# Patient Record
Sex: Female | Born: 1950 | Race: White | Hispanic: No | Marital: Married | Smoking: Never smoker
Health system: Southern US, Community
[De-identification: ages and names within clinical notes are randomized; demographics above are authoritative.]

## PROBLEM LIST (undated history)

## (undated) DIAGNOSIS — J449 Chronic obstructive pulmonary disease, unspecified: Secondary | ICD-10-CM

## (undated) DIAGNOSIS — I10 Essential (primary) hypertension: Secondary | ICD-10-CM

## (undated) DIAGNOSIS — N289 Disorder of kidney and ureter, unspecified: Secondary | ICD-10-CM

---

## 2004-08-21 ENCOUNTER — Ambulatory Visit: Payer: Self-pay

## 2005-10-21 ENCOUNTER — Ambulatory Visit: Payer: Self-pay

## 2007-02-23 ENCOUNTER — Ambulatory Visit: Payer: Self-pay

## 2008-06-06 ENCOUNTER — Ambulatory Visit: Payer: Self-pay

## 2009-12-25 ENCOUNTER — Ambulatory Visit: Payer: Self-pay

## 2011-03-31 ENCOUNTER — Ambulatory Visit: Payer: Self-pay | Admitting: Family Medicine

## 2013-02-16 ENCOUNTER — Ambulatory Visit: Payer: Self-pay

## 2017-03-12 ENCOUNTER — Other Ambulatory Visit: Payer: Self-pay | Admitting: Family Medicine

## 2017-03-12 DIAGNOSIS — Z1231 Encounter for screening mammogram for malignant neoplasm of breast: Secondary | ICD-10-CM

## 2017-08-21 ENCOUNTER — Other Ambulatory Visit: Payer: Self-pay | Admitting: Family Medicine

## 2017-08-21 DIAGNOSIS — Z0000 Encounter for general adult medical examination without abnormal findings: Secondary | ICD-10-CM

## 2017-09-17 ENCOUNTER — Ambulatory Visit
Admission: RE | Admit: 2017-09-17 | Discharge: 2017-09-17 | Disposition: A | Discharge status: Home / Self Care | Payer: Medicare HMO | Source: Ambulatory Visit | Attending: Family Medicine | Admitting: Family Medicine

## 2017-09-17 DIAGNOSIS — R928 Other abnormal and inconclusive findings on diagnostic imaging of breast: Secondary | ICD-10-CM | POA: Diagnosis not present

## 2017-09-17 DIAGNOSIS — Z1231 Encounter for screening mammogram for malignant neoplasm of breast: Secondary | ICD-10-CM

## 2017-09-17 DIAGNOSIS — M810 Age-related osteoporosis without current pathological fracture: Secondary | ICD-10-CM | POA: Insufficient documentation

## 2017-09-17 DIAGNOSIS — Z13820 Encounter for screening for osteoporosis: Secondary | ICD-10-CM | POA: Diagnosis present

## 2017-09-17 DIAGNOSIS — Z0000 Encounter for general adult medical examination without abnormal findings: Secondary | ICD-10-CM

## 2017-09-22 ENCOUNTER — Other Ambulatory Visit: Payer: Self-pay | Admitting: Family Medicine

## 2017-09-22 DIAGNOSIS — R921 Mammographic calcification found on diagnostic imaging of breast: Secondary | ICD-10-CM

## 2017-09-22 DIAGNOSIS — R928 Other abnormal and inconclusive findings on diagnostic imaging of breast: Secondary | ICD-10-CM

## 2017-09-28 ENCOUNTER — Ambulatory Visit
Admission: RE | Admit: 2017-09-28 | Discharge: 2017-09-28 | Disposition: A | Discharge status: Home / Self Care | Payer: Medicare HMO | Source: Ambulatory Visit | Attending: Family Medicine | Admitting: Family Medicine

## 2017-09-28 DIAGNOSIS — R921 Mammographic calcification found on diagnostic imaging of breast: Secondary | ICD-10-CM

## 2017-09-28 DIAGNOSIS — R928 Other abnormal and inconclusive findings on diagnostic imaging of breast: Secondary | ICD-10-CM | POA: Insufficient documentation

## 2017-09-30 ENCOUNTER — Other Ambulatory Visit: Payer: Self-pay | Admitting: Family Medicine

## 2017-09-30 DIAGNOSIS — R928 Other abnormal and inconclusive findings on diagnostic imaging of breast: Secondary | ICD-10-CM

## 2017-09-30 DIAGNOSIS — R921 Mammographic calcification found on diagnostic imaging of breast: Secondary | ICD-10-CM

## 2017-10-07 ENCOUNTER — Ambulatory Visit
Admission: RE | Admit: 2017-10-07 | Discharge: 2017-10-07 | Disposition: A | Discharge status: Home / Self Care | Payer: Medicare HMO | Source: Ambulatory Visit | Attending: Family Medicine | Admitting: Family Medicine

## 2017-10-07 DIAGNOSIS — R921 Mammographic calcification found on diagnostic imaging of breast: Secondary | ICD-10-CM

## 2017-10-07 DIAGNOSIS — R928 Other abnormal and inconclusive findings on diagnostic imaging of breast: Secondary | ICD-10-CM

## 2017-10-07 HISTORY — PX: BREAST BIOPSY: SHX20

## 2017-10-08 LAB — SURGICAL PATHOLOGY

## 2018-05-11 ENCOUNTER — Other Ambulatory Visit: Payer: Self-pay | Admitting: Family Medicine

## 2018-05-11 DIAGNOSIS — Z1231 Encounter for screening mammogram for malignant neoplasm of breast: Secondary | ICD-10-CM

## 2018-09-20 ENCOUNTER — Ambulatory Visit
Admission: RE | Admit: 2018-09-20 | Discharge: 2018-09-20 | Disposition: A | Discharge status: Home / Self Care | Payer: Medicare HMO | Source: Ambulatory Visit | Attending: Family Medicine | Admitting: Family Medicine

## 2018-09-20 ENCOUNTER — Other Ambulatory Visit: Payer: Self-pay

## 2018-09-20 DIAGNOSIS — Z1231 Encounter for screening mammogram for malignant neoplasm of breast: Secondary | ICD-10-CM | POA: Diagnosis present

## 2018-09-24 ENCOUNTER — Encounter: Payer: Self-pay | Admitting: *Deleted

## 2018-09-24 ENCOUNTER — Other Ambulatory Visit: Payer: Self-pay

## 2018-09-24 DIAGNOSIS — N183 Chronic kidney disease, stage 3 (moderate): Secondary | ICD-10-CM | POA: Diagnosis present

## 2018-09-24 DIAGNOSIS — E876 Hypokalemia: Secondary | ICD-10-CM | POA: Diagnosis present

## 2018-09-24 DIAGNOSIS — J9601 Acute respiratory failure with hypoxia: Secondary | ICD-10-CM | POA: Diagnosis present

## 2018-09-24 DIAGNOSIS — J189 Pneumonia, unspecified organism: Principal | ICD-10-CM | POA: Diagnosis present

## 2018-09-24 DIAGNOSIS — R509 Fever, unspecified: Secondary | ICD-10-CM | POA: Diagnosis not present

## 2018-09-24 DIAGNOSIS — J440 Chronic obstructive pulmonary disease with acute lower respiratory infection: Secondary | ICD-10-CM | POA: Diagnosis present

## 2018-09-24 DIAGNOSIS — Z880 Allergy status to penicillin: Secondary | ICD-10-CM

## 2018-09-24 DIAGNOSIS — Z79899 Other long term (current) drug therapy: Secondary | ICD-10-CM

## 2018-09-24 DIAGNOSIS — Z881 Allergy status to other antibiotic agents status: Secondary | ICD-10-CM

## 2018-09-24 DIAGNOSIS — I129 Hypertensive chronic kidney disease with stage 1 through stage 4 chronic kidney disease, or unspecified chronic kidney disease: Secondary | ICD-10-CM | POA: Diagnosis present

## 2018-09-24 LAB — COMPREHENSIVE METABOLIC PANEL
ALK PHOS: 120 U/L (ref 38–126)
ALT: 41 U/L (ref 0–44)
AST: 43 U/L — AB (ref 15–41)
Albumin: 4.2 g/dL (ref 3.5–5.0)
Anion gap: 13 (ref 5–15)
BILIRUBIN TOTAL: 1.2 mg/dL (ref 0.3–1.2)
BUN: 22 mg/dL (ref 8–23)
CHLORIDE: 95 mmol/L — AB (ref 98–111)
CO2: 27 mmol/L (ref 22–32)
CREATININE: 0.93 mg/dL (ref 0.44–1.00)
Calcium: 9.9 mg/dL (ref 8.9–10.3)
GFR calc Af Amer: >60 mL/min (ref >60)
Glucose, Bld: 145 mg/dL — ABNORMAL HIGH (ref 70–99)
Potassium: 3.4 mmol/L — ABNORMAL LOW (ref 3.5–5.1)
Sodium: 135 mmol/L (ref 135–145)
TOTAL PROTEIN: 8.1 g/dL (ref 6.5–8.1)

## 2018-09-24 LAB — CBC WITH DIFFERENTIAL/PLATELET
Abs Immature Granulocytes: 0.07 10*3/uL (ref 0.00–0.07)
BASOS ABS: 0 10*3/uL (ref 0.0–0.1)
BASOS PCT: 0 %
Eosinophils Absolute: 0 10*3/uL (ref 0.0–0.5)
Eosinophils Relative: 0 %
HCT: 40.6 % (ref 36.0–46.0)
Hemoglobin: 13.4 g/dL (ref 12.0–15.0)
Immature Granulocytes: 1 %
LYMPHS PCT: 11 %
Lymphs Abs: 1.5 10*3/uL (ref 0.7–4.0)
MCH: 29.2 pg (ref 26.0–34.0)
MCHC: 33 g/dL (ref 30.0–36.0)
MCV: 88.5 fL (ref 80.0–100.0)
MONO ABS: 1.2 10*3/uL — AB (ref 0.1–1.0)
Monocytes Relative: 8 %
Neutro Abs: 11.7 10*3/uL — ABNORMAL HIGH (ref 1.7–7.7)
Neutrophils Relative %: 80 %
PLATELETS: 250 10*3/uL (ref 150–400)
RBC: 4.59 MIL/uL (ref 3.87–5.11)
RDW: 12.9 % (ref 11.5–15.5)
WBC: 14.5 10*3/uL — ABNORMAL HIGH (ref 4.0–10.5)
nRBC: 0 % (ref 0.0–0.2)

## 2018-09-24 LAB — URINALYSIS, COMPLETE (UACMP) WITH MICROSCOPIC
Bilirubin Urine: NEGATIVE
GLUCOSE, UA: NEGATIVE mg/dL
Hgb urine dipstick: NEGATIVE
Ketones, ur: 20 mg/dL — AB
Leukocytes,Ua: NEGATIVE
NITRITE: NEGATIVE
PH: 5 (ref 5.0–8.0)
PROTEIN: NEGATIVE mg/dL
SPECIFIC GRAVITY, URINE: 1.012 (ref 1.005–1.030)

## 2018-09-24 LAB — LACTIC ACID, PLASMA: Lactic Acid, Venous: 1.3 mmol/L (ref 0.5–1.9)

## 2018-09-24 MED ORDER — SODIUM CHLORIDE 0.9% FLUSH
3.0000 mL | Freq: Once | INTRAVENOUS | Status: AC
  Administered 2018-09-25: 3 mL via INTRAVENOUS

## 2018-09-24 NOTE — ED Triage Notes (Signed)
Pt reports fever, onset this afternoon when waking up from a nap. Has had a cough and sinus congestion. Tmax 101.0 today, no meds for the same.

## 2018-09-25 ENCOUNTER — Emergency Department: Payer: Medicare HMO

## 2018-09-25 ENCOUNTER — Inpatient Hospital Stay
Admission: EM | Admit: 2018-09-25 | Discharge: 2018-09-27 | DRG: 193 | Disposition: A | Discharge status: Home / Self Care | Payer: Medicare HMO | Attending: Internal Medicine | Admitting: Internal Medicine

## 2018-09-25 ENCOUNTER — Other Ambulatory Visit: Payer: Self-pay

## 2018-09-25 DIAGNOSIS — J189 Pneumonia, unspecified organism: Secondary | ICD-10-CM | POA: Diagnosis present

## 2018-09-25 DIAGNOSIS — Z79899 Other long term (current) drug therapy: Secondary | ICD-10-CM | POA: Diagnosis not present

## 2018-09-25 DIAGNOSIS — E876 Hypokalemia: Secondary | ICD-10-CM | POA: Diagnosis present

## 2018-09-25 DIAGNOSIS — J440 Chronic obstructive pulmonary disease with acute lower respiratory infection: Secondary | ICD-10-CM | POA: Diagnosis present

## 2018-09-25 DIAGNOSIS — Z881 Allergy status to other antibiotic agents status: Secondary | ICD-10-CM | POA: Diagnosis not present

## 2018-09-25 DIAGNOSIS — I129 Hypertensive chronic kidney disease with stage 1 through stage 4 chronic kidney disease, or unspecified chronic kidney disease: Secondary | ICD-10-CM | POA: Diagnosis present

## 2018-09-25 DIAGNOSIS — Z880 Allergy status to penicillin: Secondary | ICD-10-CM | POA: Diagnosis not present

## 2018-09-25 DIAGNOSIS — R509 Fever, unspecified: Secondary | ICD-10-CM | POA: Diagnosis present

## 2018-09-25 DIAGNOSIS — N183 Chronic kidney disease, stage 3 (moderate): Secondary | ICD-10-CM | POA: Diagnosis present

## 2018-09-25 DIAGNOSIS — J9601 Acute respiratory failure with hypoxia: Secondary | ICD-10-CM | POA: Diagnosis present

## 2018-09-25 HISTORY — DX: Essential (primary) hypertension: I10

## 2018-09-25 HISTORY — DX: Chronic obstructive pulmonary disease, unspecified: J44.9

## 2018-09-25 HISTORY — DX: Disorder of kidney and ureter, unspecified: N28.9

## 2018-09-25 LAB — HEMOGLOBIN A1C
Hgb A1c MFr Bld: 5.3 % (ref 4.8–5.6)
Mean Plasma Glucose: 105.41 mg/dL

## 2018-09-25 LAB — TSH: TSH: 2.625 u[IU]/mL (ref 0.350–4.500)

## 2018-09-25 LAB — INFLUENZA PANEL BY PCR (TYPE A & B)
Influenza A By PCR: NEGATIVE
Influenza B By PCR: NEGATIVE

## 2018-09-25 MED ORDER — IPRATROPIUM-ALBUTEROL 0.5-2.5 (3) MG/3ML IN SOLN
3.0000 mL | Freq: Once | RESPIRATORY_TRACT | Status: AC
  Administered 2018-09-25: 3 mL via RESPIRATORY_TRACT

## 2018-09-25 MED ORDER — ACETAMINOPHEN 325 MG PO TABS: 650.0000 mg | ORAL_TABLET | Freq: Four times a day (QID) | ORAL | Status: DC | PRN

## 2018-09-25 MED ORDER — LEVOFLOXACIN IN D5W 500 MG/100ML IV SOLN
500.0000 mg | Freq: Once | INTRAVENOUS | Status: AC
  Administered 2018-09-25: 500 mg via INTRAVENOUS
  Filled 2018-09-25: qty 100

## 2018-09-25 MED ORDER — METHYLPREDNISOLONE SODIUM SUCC 125 MG IJ SOLR
125.0000 mg | Freq: Once | INTRAMUSCULAR | Status: AC
  Administered 2018-09-25: 125 mg via INTRAVENOUS
  Filled 2018-09-25: qty 2

## 2018-09-25 MED ORDER — LEVOFLOXACIN IN D5W 250 MG/50ML IV SOLN
250.0000 mg | INTRAVENOUS | Status: DC
  Administered 2018-09-26 – 2018-09-27 (×2): 250 mg via INTRAVENOUS
  Filled 2018-09-25 (×2): qty 50

## 2018-09-25 MED ORDER — VITAMIN D 25 MCG (1000 UNIT) PO TABS
1000.0000 [IU] | ORAL_TABLET | Freq: Every day | ORAL | Status: DC
  Administered 2018-09-25 – 2018-09-27 (×3): 1000 [IU] via ORAL
  Filled 2018-09-25 (×3): qty 1

## 2018-09-25 MED ORDER — TRIAMTERENE-HCTZ 37.5-25 MG PO TABS
1.0000 | ORAL_TABLET | Freq: Every day | ORAL | Status: DC
  Administered 2018-09-25 – 2018-09-27 (×3): 1 via ORAL
  Filled 2018-09-25 (×3): qty 1

## 2018-09-25 MED ORDER — DOCUSATE SODIUM 100 MG PO CAPS
100.0000 mg | ORAL_CAPSULE | Freq: Two times a day (BID) | ORAL | Status: DC
  Administered 2018-09-25 – 2018-09-27 (×5): 100 mg via ORAL
  Filled 2018-09-25 (×5): qty 1

## 2018-09-25 MED ORDER — ONDANSETRON HCL 4 MG/2ML IJ SOLN: 4.0000 mg | Freq: Four times a day (QID) | INTRAMUSCULAR | Status: DC | PRN

## 2018-09-25 MED ORDER — POTASSIUM CHLORIDE ER 8 MEQ PO TBCR
8.0000 meq | EXTENDED_RELEASE_TABLET | Freq: Every day | ORAL | Status: DC
  Administered 2018-09-25 – 2018-09-26 (×2): 8 meq via ORAL
  Filled 2018-09-25 (×4): qty 1

## 2018-09-25 MED ORDER — ACETAMINOPHEN 650 MG RE SUPP: 650.0000 mg | Freq: Four times a day (QID) | RECTAL | Status: DC | PRN

## 2018-09-25 MED ORDER — AMLODIPINE BESYLATE 5 MG PO TABS
5.0000 mg | ORAL_TABLET | Freq: Every day | ORAL | Status: DC
  Administered 2018-09-25 – 2018-09-27 (×2): 5 mg via ORAL
  Filled 2018-09-25 (×2): qty 1

## 2018-09-25 MED ORDER — ONDANSETRON HCL 4 MG PO TABS: 4.0000 mg | ORAL_TABLET | Freq: Four times a day (QID) | ORAL | Status: DC | PRN

## 2018-09-25 MED ORDER — IOHEXOL 300 MG/ML  SOLN
75.0000 mL | Freq: Once | INTRAMUSCULAR | Status: AC | PRN
  Administered 2018-09-25: 75 mL via INTRAVENOUS

## 2018-09-25 MED ORDER — ENOXAPARIN SODIUM 40 MG/0.4ML ~~LOC~~ SOLN
40.0000 mg | SUBCUTANEOUS | Status: DC
  Administered 2018-09-25 – 2018-09-26 (×2): 40 mg via SUBCUTANEOUS
  Filled 2018-09-25 (×2): qty 0.4

## 2018-09-25 MED ORDER — IPRATROPIUM-ALBUTEROL 0.5-2.5 (3) MG/3ML IN SOLN
RESPIRATORY_TRACT | Status: AC
  Administered 2018-09-25: 3 mL via RESPIRATORY_TRACT
  Filled 2018-09-25: qty 6

## 2018-09-25 MED ORDER — METOPROLOL SUCCINATE ER 50 MG PO TB24
100.0000 mg | ORAL_TABLET | Freq: Every day | ORAL | Status: DC
  Administered 2018-09-25 – 2018-09-27 (×3): 100 mg via ORAL
  Filled 2018-09-25 (×3): qty 2

## 2018-09-25 NOTE — Progress Notes (Signed)
Advance care planning  Purpose of Encounter Pneumonia  Parties in Attendance Patient and husband at bedside  Patients Decisional capacity Alert and oriented.  Able to make medical decisions.  Documented healthcare power of attorney is her husband who is at bedside.  ACP documents in place.  Discussed with patient regarding her pneumonia, prognosis, treatment plan.  All questions answered.  CODE STATUS discussed and patient would like aggressive care with CPR and intubation if needed.  Orders entered  FULL CODE  Time spent - 20 minutes

## 2018-09-25 NOTE — ED Notes (Signed)
Pt up to use bedside toilet.

## 2018-09-25 NOTE — ED Notes (Signed)
ED TO INPATIENT HANDOFF REPORT  ED Nurse Name and Phone #: [phone]  S Name/Age/Gender Crystal Klein 68 y.o. female Room/Bed: ED06A/ED06A  Code Status   Code Status: Not on file  Home/SNF/Other Home Patient oriented to: self, place, person, time Is this baseline? Yes   Triage Complete: Triage complete  Chief Complaint Cough  Triage Note Pt reports fever, onset this afternoon when waking up from a nap. Has had a cough and sinus congestion. Tmax 101.0 today, no meds for the same.     Allergies Allergies  Allergen Reactions  . Amoxicillin   . Biaxin [Clarithromycin]   . Penicillins     Level of Care/Admitting Diagnosis ED Disposition    ED Disposition Condition Comment   Admit  Hospital Area: [facility] [[id number]]  Level of Care: Med-Surg [16]  Diagnosis: CAP (community acquired pneumonia) [606301]  Admitting Physician: Arnaldo Natal [[id number]]  Attending Physician: Arnaldo Natal [[id number]]  Estimated length of stay: past midnight tomorrow  Certification:: I certify this patient will need inpatient services for at least 2 midnights  PT Class (Do Not Modify): Inpatient [101]  PT Acc Code (Do Not Modify): Private [1]       B Medical/Surgery History Past Medical History:  Diagnosis Date  . COPD (chronic obstructive pulmonary disease) (HCC)   . Hypertension   . Renal disorder    stage 4 kidney disease   Past Surgical History:  Procedure Laterality Date  . BREAST BIOPSY Left 10/07/2017   affirm bx ribbon clip, : FIBROADENOMATOUS CHANGE     A IV Location/Drains/Wounds Patient Lines/Drains/Airways Status   Active Line/Drains/Airways    Name:   Placement date:   Placement time:   Site:   Days:   Peripheral IV 09/25/18 Right Antecubital   09/25/18    0137    Antecubital   less than 1          Intake/Output Last 24 hours No intake or output data in the 24 hours ending 09/25/18 0343  Labs/Imaging Results for orders placed  or performed during the hospital encounter of 09/25/18 (from the past 48 hour(s))  Lactic acid, plasma     Status: None   Collection Time: 09/24/18  8:15 PM  Result Value Ref Range   Lactic Acid, Venous 1.3 0.5 - 1.9 mmol/L    Comment: Performed at [facility], [address] [place], [place] [address]  Comprehensive metabolic panel     Status: Abnormal   Collection Time: 09/24/18  8:15 PM  Result Value Ref Range   Sodium 135 135 - 145 mmol/L   Potassium 3.4 (L) 3.5 - 5.1 mmol/L   Chloride 95 (L) 98 - 111 mmol/L   CO2 27 22 - 32 mmol/L   Glucose, Bld 145 (H) 70 - 99 mg/dL   BUN 22 8 - 23 mg/dL   Creatinine, Ser 7.06 0.44 - 1.00 mg/dL   Calcium 9.9 8.9 - 23.7 mg/dL   Total Protein 8.1 6.5 - 8.1 g/dL   Albumin 4.2 3.5 - 5.0 g/dL   AST 43 (H) 15 - 41 U/L   ALT 41 0 - 44 U/L   Alkaline Phosphatase 120 38 - 126 U/L   Total Bilirubin 1.2 0.3 - 1.2 mg/dL   GFR calc non Af Amer >60 >60 mL/min   GFR calc Af Amer >60 >60 mL/min   Anion gap 13 5 - 15    Comment: Performed at [facility], [address]  [place], [place] [address]  CBC with Differential     Status: Abnormal   Collection Time: 09/24/18  8:15 PM  Result Value Ref Range   WBC 14.5 (H) 4.0 - 10.5 K/uL   RBC 4.59 3.87 - 5.11 MIL/uL   Hemoglobin 13.4 12.0 - 15.0 g/dL   HCT 19.1 47.8 - 29.5 %   MCV 88.5 80.0 - 100.0 fL   MCH 29.2 26.0 - 34.0 pg   MCHC 33.0 30.0 - 36.0 g/dL   RDW 62.1 30.8 - 65.7 %   Platelets 250 150 - 400 K/uL   nRBC 0.0 0.0 - 0.2 %   Neutrophils Relative % 80 %   Neutro Abs 11.7 (H) 1.7 - 7.7 K/uL   Lymphocytes Relative 11 %   Lymphs Abs 1.5 0.7 - 4.0 K/uL   Monocytes Relative 8 %   Monocytes Absolute 1.2 (H) 0.1 - 1.0 K/uL   Eosinophils Relative 0 %   Eosinophils Absolute 0.0 0.0 - 0.5 K/uL   Basophils Relative 0 %   Basophils Absolute 0.0 0.0 - 0.1 K/uL   Immature Granulocytes 1 %   Abs Immature Granulocytes 0.07 0.00 - 0.07 K/uL    Comment: Performed at [facility], [address] [place], [place] [address]  Urinalysis, Complete w Microscopic     Status: Abnormal   Collection Time: 09/24/18  8:15 PM  Result Value Ref Range   Color, Urine YELLOW (A) YELLOW   APPearance CLEAR (A) CLEAR   Specific Gravity, Urine 1.012 1.005 - 1.030   pH 5.0 5.0 - 8.0   Glucose, UA NEGATIVE NEGATIVE mg/dL   Hgb urine dipstick NEGATIVE NEGATIVE   Bilirubin Urine NEGATIVE NEGATIVE   Ketones, ur 20 (A) NEGATIVE mg/dL   Protein, ur NEGATIVE NEGATIVE mg/dL   Nitrite NEGATIVE NEGATIVE   Leukocytes,Ua NEGATIVE NEGATIVE   RBC / HPF 0-5 0 - 5 RBC/hpf   WBC, UA 0-5 0 - 5 WBC/hpf   Bacteria, UA RARE (A) NONE SEEN   Squamous Epithelial / LPF 0-5 0 - 5   Mucus PRESENT     Comment: Performed at [facility], [address], [place], [place] [address]  Influenza panel by PCR (type A & B)     Status: None   Collection Time: 09/25/18  1:45 AM  Result Value Ref Range   Influenza A By PCR NEGATIVE NEGATIVE   Influenza B By PCR NEGATIVE NEGATIVE    Comment: (NOTE) The Xpert Xpress Flu assay is intended as an aid in the diagnosis of  influenza and should not be used as a sole basis for treatment.  This  assay is FDA approved for nasopharyngeal swab specimens only. Nasal  washings and aspirates are unacceptable for Xpert Xpress Flu testing. Performed at [facility], [address] [place], [place] [address]    Dg Chest 2 View  Result Date: 09/25/2018 CLINICAL DATA:  Fever onset this afternoon to 101 max, cough, sinus congestion; history hypertension, COPD EXAM: CHEST - 2 VIEW COMPARISON:  None. FINDINGS: Normal heart size, mediastinal contours, and pulmonary vascularity. Atherosclerotic calcification aorta. Emphysematous and bronchitic changes consistent with COPD. Density of LEFT upper lobe question nodular density versus calcified plaque. Minimal atelectasis at RIGHT base. No definite infiltrate or pneumothorax. Bones demineralized. IMPRESSION: COPD  changes with RIGHT basilar atelectasis and questionable LEFT upper lobe nodular density; CT chest recommended to exclude tumor. Electronically Signed   By: Ulyses Southward M.D.   On: 09/25/2018 00:49  Ct Chest W Contrast  Result Date: 09/25/2018 CLINICAL DATA:  Fever, cough and sinus congestion. EXAM: CT CHEST WITH CONTRAST TECHNIQUE: Multidetector CT imaging of the chest was performed during intravenous contrast administration. CONTRAST:  51mL OMNIPAQUE IOHEXOL 300 MG/ML  SOLN COMPARISON:  Chest radiograph 09/25/2018 FINDINGS: Cardiovascular: No significant vascular findings. Normal heart size. No pericardial effusion. Mediastinum/Nodes: No enlarged mediastinal, hilar, or axillary lymph nodes. Thyroid gland, trachea, and esophagus demonstrate no significant findings. Lungs/Pleura: Patchy bilateral predominantly subpleural areas of ground-glass opacities and tree-in-bud opacities. The most prominent area of consolidation is in the left upper lobe, image 52/151, and it corresponds to the abnormality seen radiographically. Upper Abdomen: No acute abnormality. Musculoskeletal: No chest wall abnormality. No acute or significant osseous findings. IMPRESSION: Patchy bilateral predominantly subpleural areas of ground-glass and tree-in-bud opacities. The most prominent area of consolidation is in the left upper lobe and corresponds to the abnormality seen radiographically. In the acute clinical setting these findings are most consistent with bilateral pneumonia. Follow-up to resolution after empiric treatment is recommended to assure clearance, as underlying pulmonary nodule in the left upper lobe can not be excluded. Electronically Signed   By: Ted Mcalpine M.D.   On: 09/25/2018 02:33    Pending Labs [employer] (From admission, onward)    Start     Ordered   Signed and Held  Creatinine, serum  (enoxaparin (LOVENOX)    CrCl >/= 30 ml/min)  Weekly,   R    Comments:  while on enoxaparin therapy    Signed  and Held   Signed and Held  TSH  Add-on,   R     Signed and Held   Signed and Held  Hemoglobin A1c  Add-on,   R     Signed and Held          Vitals/Pain Today's Vitals   09/25/18 0159 09/25/18 0230 09/25/18 0234 09/25/18 0300  BP:  103/60  115/65  Pulse: 97 98  91  Resp:  18  20  Temp:      TempSrc:      SpO2: 100% 100%  100%  Weight:      Height:      PainSc:   0-No pain     Isolation Precautions Droplet precaution  Medications Medications  levofloxacin (LEVAQUIN) IVPB 500 mg (500 mg Intravenous New Bag/Given 09/25/18 0245)  sodium chloride flush (NS) 0.9 % injection 3 mL (3 mLs Intravenous Given 09/25/18 0138)  ipratropium-albuterol (DUONEB) 0.5-2.5 (3) MG/3ML nebulizer solution 3 mL (3 mLs Nebulization Given 09/25/18 0138)  ipratropium-albuterol (DUONEB) 0.5-2.5 (3) MG/3ML nebulizer solution 3 mL (3 mLs Nebulization Given 09/25/18 0138)  methylPREDNISolone sodium succinate (SOLU-MEDROL) 125 mg/2 mL injection 125 mg (125 mg Intravenous Given 09/25/18 0139)  iohexol (OMNIPAQUE) 300 MG/ML solution 75 mL (75 mLs Intravenous Contrast Given 09/25/18 0207)    Mobility walks with device Low fall risk   Focused Assessments Pulmonary Assessment Handoff:  Lung sounds: Bilateral Breath Sounds: Clear L Breath Sounds: Diminished(lower only) R Breath Sounds: Diminished(lower only ) O2 Device: Nasal Cannula O2 Flow Rate (L/min): 2 L/min      R Recommendations: See Admitting Provider Note  Report given to:   Additional Notes: Pt on 2L [place], not on chronically, 20g R AC

## 2018-09-25 NOTE — ED Provider Notes (Signed)
[facility] Emergency Department Provider Note   First MD Initiated Contact with Patient 09/25/18 [phone]     (approximate)  I have reviewed the triage vital signs and the nursing notes.   HISTORY  Chief Complaint Fever    HPI Crystal Klein is a 68 y.o. female with below list of chronic medical conditions including COPD presents to the emergency department with a one-week history of progressive cough and congestion with acute onset of fever today.  Patient admits to progressive dyspnea since this afternoon.  Patient states Tmax at home 101.  Patient admits to receiving influenza vaccine this year.        Past Medical History:  Diagnosis Date  . COPD (chronic obstructive pulmonary disease) (HCC)   . Hypertension   . Renal disorder    stage 4 kidney disease    There are no active problems to display for this patient.   Past Surgical History:  Procedure Laterality Date  . BREAST BIOPSY Left 10/07/2017   affirm bx ribbon clip, : FIBROADENOMATOUS CHANGE    Prior to Admission medications   Medication Sig Start Date End Date Taking? Authorizing Provider  amLODipine (NORVASC) 5 MG tablet Take 5 mg by mouth daily. 09/20/18   [provider]  metoprolol succinate (TOPROL-XL) 100 MG 24 hr tablet Take 100 mg by mouth daily. 09/20/18   [provider]  potassium chloride (KLOR-CON) 8 MEQ tablet Take 8 mEq by mouth daily. 08/16/18   [provider]  triamterene-hydrochlorothiazide (MAXZIDE) 75-50 MG tablet Take 1 tablet by mouth daily. 09/20/18   [provider]    Allergies Amoxicillin; Biaxin [clarithromycin]; and Penicillins  No family history on file.  Social History Social History   Tobacco Use  . Smoking status: Never Smoker  Substance Use Topics  . Alcohol use: Never    Frequency: Never  . Drug use: Never    Review of Systems Constitutional: Positive for fever/chills Eyes: No visual changes. ENT: No  sore throat. Cardiovascular: Denies chest pain. Respiratory: As if her cough and dyspnea. Gastrointestinal: No abdominal pain.  No nausea, no vomiting.  No diarrhea.  No constipation. Genitourinary: Negative for dysuria. Musculoskeletal: Negative for neck pain.  Negative for back pain. Integumentary: Negative for rash. Neurological: Negative for headaches, focal weakness or numbness.  ____________________________________________   PHYSICAL EXAM:  VITAL SIGNS: ED Triage Vitals  Enc Vitals Group     BP 09/24/18 2010 139/63     Pulse Rate 09/24/18 2010 (!) 105     Resp 09/24/18 2010 (!) 22     Temp 09/24/18 2010 99.5 F (37.5 C)     Temp Source 09/24/18 2010 Oral     SpO2 09/24/18 2010 90 %     Weight 09/24/18 2011 55.3 kg (122 lb)     Height 09/24/18 2011 1.499 m (4\' 11" )     Head Circumference --      Peak Flow --      Pain Score 09/24/18 2010 0     Pain Loc --      Pain Edu? --      Excl. in GC? --     Constitutional: Alert and oriented.  Apparent respiratory distress Eyes: Conjunctivae are normal.  Mouth/Throat: Mucous membranes are moist.  Oropharynx non-erythematous. Neck: No stridor.   Cardiovascular: Tachycardia, regular rhythm. Good peripheral circulation. Grossly normal heart sounds. Respiratory: Tachypnea, positive accessory respiratory muscle use, diffuse rhonchi Gastrointestinal: Soft and nontender. No distention.  Musculoskeletal: No lower  extremity tenderness nor edema. No gross deformities of extremities. Neurologic:  Normal speech and language. No gross focal neurologic deficits are appreciated.  Skin:  Skin is warm, dry and intact. No rash noted. Psychiatric: Mood and affect are normal. Speech and behavior are normal.  ____________________________________________   LABS (all labs ordered are listed, but only abnormal results are displayed)  Labs Reviewed  COMPREHENSIVE METABOLIC PANEL - Abnormal; Notable for the following components:      Result  Value   Potassium 3.4 (*)    Chloride 95 (*)    Glucose, Bld 145 (*)    AST 43 (*)    All other components within normal limits  CBC WITH DIFFERENTIAL/PLATELET - Abnormal; Notable for the following components:   WBC 14.5 (*)    Neutro Abs 11.7 (*)    Monocytes Absolute 1.2 (*)    All other components within normal limits  URINALYSIS, COMPLETE (UACMP) WITH MICROSCOPIC - Abnormal; Notable for the following components:   Color, Urine YELLOW (*)    APPearance CLEAR (*)    Ketones, ur 20 (*)    Bacteria, UA RARE (*)    All other components within normal limits  LACTIC ACID, PLASMA  INFLUENZA PANEL BY PCR (TYPE A & B)   ___  RADIOLOGY I,  N Jasmin Winberry, personally viewed and evaluated these images (plain radiographs) as part of my medical decision making, as well as reviewing the written report by the radiologist.  ED MD interpretation: Chest x-ray revealed COPD changes with right basilar atelectasis and questionable left upper lobe nodular density the radiologist. CT chest revealed that she bilateral predominantly subpleural areas of groundglass and 3 budding opacities concerning for pneumonia.   Official radiology report(s): Dg Chest 2 View  Result Date: 09/25/2018 CLINICAL DATA:  Fever onset this afternoon to 101 max, cough, sinus congestion; history hypertension, COPD EXAM: CHEST - 2 VIEW COMPARISON:  None. FINDINGS: Normal heart size, mediastinal contours, and pulmonary vascularity. Atherosclerotic calcification aorta. Emphysematous and bronchitic changes consistent with COPD. Density of LEFT upper lobe question nodular density versus calcified plaque. Minimal atelectasis at RIGHT base. No definite infiltrate or pneumothorax. Bones demineralized. IMPRESSION: COPD changes with RIGHT basilar atelectasis and questionable LEFT upper lobe nodular density; CT chest recommended to exclude tumor. Electronically Signed   By: Ulyses Southward M.D.   On: 09/25/2018 00:49   Ct Chest W Contrast   Result Date: 09/25/2018 CLINICAL DATA:  Fever, cough and sinus congestion. EXAM: CT CHEST WITH CONTRAST TECHNIQUE: Multidetector CT imaging of the chest was performed during intravenous contrast administration. CONTRAST:  75mL OMNIPAQUE IOHEXOL 300 MG/ML  SOLN COMPARISON:  Chest radiograph 09/25/2018 FINDINGS: Cardiovascular: No significant vascular findings. Normal heart size. No pericardial effusion. Mediastinum/Nodes: No enlarged mediastinal, hilar, or axillary lymph nodes. Thyroid gland, trachea, and esophagus demonstrate no significant findings. Lungs/Pleura: Patchy bilateral predominantly subpleural areas of ground-glass opacities and tree-in-bud opacities. The most prominent area of consolidation is in the left upper lobe, image 52/151, and it corresponds to the abnormality seen radiographically. Upper Abdomen: No acute abnormality. Musculoskeletal: No chest wall abnormality. No acute or significant osseous findings. IMPRESSION: Patchy bilateral predominantly subpleural areas of ground-glass and tree-in-bud opacities. The most prominent area of consolidation is in the left upper lobe and corresponds to the abnormality seen radiographically. In the acute clinical setting these findings are most consistent with bilateral pneumonia. Follow-up to resolution after empiric treatment is recommended to assure clearance, as underlying pulmonary nodule in the left upper lobe can not be  excluded. Electronically Signed   By: Ted Mcalpine M.D.   On: 09/25/2018 02:33     .Critical Care Performed by: Darci Current, MD Authorized by: Darci Current, MD   Critical care provider statement:    Critical care time (minutes):  30   Critical care time was exclusive of:  Separately billable procedures and treating other patients   Critical care was necessary to treat or prevent imminent or life-threatening deterioration of the following conditions:  Respiratory failure   Critical care was time spent  personally by me on the following activities:  Development of treatment plan with patient or surrogate, discussions with consultants, evaluation of patient's response to treatment, examination of patient, obtaining history from patient or surrogate, ordering and performing treatments and interventions, ordering and review of laboratory studies, ordering and review of radiographic studies, pulse oximetry, re-evaluation of patient's condition and review of old charts     ____________________________________________   INITIAL IMPRESSION / MDM / ASSESSMENT AND PLAN / ED COURSE  As part of my medical decision making, I reviewed the following data within the electronic MEDICAL RECORD NUMBER  68 year old female presented with above-stated history and physical exam secondary to dyspnea cough and fever with hypoxia oxygen saturation 87% on room air on my arrival to the room..  Concern for possible pneumonia on my influenza, COPD exacerbation.  Given patient's increased work of breathing and apparent respiratory distress duo nebs x2 administered as well as Solu-Medrol 125 mg.  On reevaluation patient satting 100% on 2 L nasal cannula oxygen.  CT scan consistent with pneumonia and as such patient given Levaquin 500 mg IV secondary to patient's allergy profile.  Patient discussed with Dr. Sheryle Hail for hospital admission for further evaluation and management. ____________________________________________  FINAL CLINICAL IMPRESSION(S) / ED DIAGNOSES  Final diagnoses:  Community acquired pneumonia, unspecified laterality     MEDICATIONS GIVEN DURING THIS VISIT:  Medications  levofloxacin (LEVAQUIN) IVPB 500 mg (500 mg Intravenous New Bag/Given 09/25/18 0245)  sodium chloride flush (NS) 0.9 % injection 3 mL (3 mLs Intravenous Given 09/25/18 0138)  ipratropium-albuterol (DUONEB) 0.5-2.5 (3) MG/3ML nebulizer solution 3 mL (3 mLs Nebulization Given 09/25/18 0138)  ipratropium-albuterol (DUONEB) 0.5-2.5 (3) MG/3ML  nebulizer solution 3 mL (3 mLs Nebulization Given 09/25/18 0138)  methylPREDNISolone sodium succinate (SOLU-MEDROL) 125 mg/2 mL injection 125 mg (125 mg Intravenous Given 09/25/18 0139)  iohexol (OMNIPAQUE) 300 MG/ML solution 75 mL (75 mLs Intravenous Contrast Given 09/25/18 0207)     ED Discharge Orders    None       Note:  This document was prepared using Dragon voice recognition software and may include unintentional dictation errors.   Darci Current, MD 09/25/18 0330

## 2018-09-25 NOTE — Consult Note (Signed)
Pharmacy Antibiotic Note  Crystal Klein is a 68 y.o. female admitted on 09/25/2018 with pneumonia.  Pharmacy has been consulted for levofloxacin  Dosing. Patient has unknown PCN allergy.   Plan: Levofloxacin 500mg  x 1 dose given in ED.  Start Levofloxacin 250mg  every 24 hours starting 3/21.   Height: 4\' 11"  (149.9 cm) Weight: 122 lb (55.3 kg) IBW/kg (Calculated) : 43.2  Temp (24hrs), Avg:99.5 F (37.5 C), Min:99.5 F (37.5 C), Max:99.5 F (37.5 C)  Recent Labs  Lab 09/24/18 2015  WBC 14.5*  CREATININE 0.93  LATICACIDVEN 1.3    Estimated Creatinine Clearance: 44.5 mL/min (by C-G formula based on SCr of 0.93 mg/dL).    Allergies  Allergen Reactions  . Amoxicillin   . Biaxin [Clarithromycin]   . Penicillins    Antimicrobials this admission: 3/21 Levofloxacin >>   Thank you for allowing pharmacy to be a part of this patient's care.  Gardner Candle, PharmD, BCPS Clinical Pharmacist 09/25/2018 3:53 AM

## 2018-09-25 NOTE — H&P (Signed)
Crystal Klein is an 68 y.o. female.   Chief Complaint: Fever HPI: The patient with past medical history of hypertension, CKD and COPD presents to the emergency department complaining of fever.  The patient reports a T-max of 101F.  The patient also complains of cough that has been nonproductive.  She admits to some shortness of breath which prompted her to seek evaluation emergency department where chest x-ray demonstrated left upper lobe nodular density consistent with pneumonia.  The patient received a dose of Levaquin in the emergency department prior to the hospitalist service being called for admission.  Past Medical History:  Diagnosis Date  . COPD (chronic obstructive pulmonary disease) (HCC)   . Hypertension   . Renal disorder    stage 4 kidney disease    Past Surgical History:  Procedure Laterality Date  . BREAST BIOPSY Left 10/07/2017   affirm bx ribbon clip, : FIBROADENOMATOUS CHANGE    History reviewed. No pertinent family history. Patient reports healthy family history  Social History:  reports that she has never smoked. She has never used smokeless tobacco. She reports that she does not drink alcohol or use drugs.  Allergies:  Allergies  Allergen Reactions  . Amoxicillin Other (See Comments)    Reaction: unknown Did it involve swelling of the face/tongue/throat, SOB, or low BP? Unknown Did it involve sudden or severe rash/hives, skin peeling, or any reaction on the inside of your mouth or nose? Unknown Did you need to seek medical attention at a hospital or doctor's office? Unknown When did it last happen?unknown If all above answers are "NO", may proceed with cephalosporin use.   Marland Kitchen Penicillins Other (See Comments)    Reaction: unknown Did it involve swelling of the face/tongue/throat, SOB, or low BP? Unknown Did it involve sudden or severe rash/hives, skin peeling, or any reaction on the inside of your mouth or nose? Unknown Did you need to seek medical  attention at a hospital or doctor's office? Unknown When did it last happen?unknown If all above answers are "NO", may proceed with cephalosporin use.  . Biaxin [Clarithromycin] Rash    Medications Prior to Admission  Medication Sig Dispense Refill  . amLODipine (NORVASC) 5 MG tablet Take 5 mg by mouth daily.    . Cholecalciferol (VITAMIN D-3) 25 MCG (1000 UT) CAPS Take 1,000 Units by mouth daily after lunch.    . metoprolol succinate (TOPROL-XL) 100 MG 24 hr tablet Take 100 mg by mouth daily.    . potassium chloride (KLOR-CON) 8 MEQ tablet Take 8 mEq by mouth daily after lunch.     . triamterene-hydrochlorothiazide (MAXZIDE) 75-50 MG tablet Take 1 tablet by mouth daily.      Results for orders placed or performed during the hospital encounter of 09/25/18 (from the past 48 hour(s))  Lactic acid, plasma     Status: None   Collection Time: 09/24/18  8:15 PM  Result Value Ref Range   Lactic Acid, Venous 1.3 0.5 - 1.9 mmol/L    Comment: Performed at [facility], [address] [place], [place] [address]  Comprehensive metabolic panel     Status: Abnormal   Collection Time: 09/24/18  8:15 PM  Result Value Ref Range   Sodium 135 135 - 145 mmol/L   Potassium 3.4 (L) 3.5 - 5.1 mmol/L   Chloride 95 (L) 98 - 111 mmol/L   CO2 27 22 - 32 mmol/L   Glucose, Bld 145 (H) 70 - 99 mg/dL   BUN 22 8 -  23 mg/dL   Creatinine, Ser 8.92 0.44 - 1.00 mg/dL   Calcium 9.9 8.9 - 11.9 mg/dL   Total Protein 8.1 6.5 - 8.1 g/dL   Albumin 4.2 3.5 - 5.0 g/dL   AST 43 (H) 15 - 41 U/L   ALT 41 0 - 44 U/L   Alkaline Phosphatase 120 38 - 126 U/L   Total Bilirubin 1.2 0.3 - 1.2 mg/dL   GFR calc non Af Amer >60 >60 mL/min   GFR calc Af Amer >60 >60 mL/min   Anion gap 13 5 - 15    Comment: Performed at [facility], [address] [place], [place] [address]  CBC with Differential     Status: Abnormal   Collection Time: 09/24/18  8:15 PM  Result Value Ref Range   WBC 14.5 (H) 4.0 - 10.5  K/uL   RBC 4.59 3.87 - 5.11 MIL/uL   Hemoglobin 13.4 12.0 - 15.0 g/dL   HCT 81.4 48.1 - 85.6 %   MCV 88.5 80.0 - 100.0 fL   MCH 29.2 26.0 - 34.0 pg   MCHC 33.0 30.0 - 36.0 g/dL   RDW 31.4 97.0 - 26.3 %   Platelets 250 150 - 400 K/uL   nRBC 0.0 0.0 - 0.2 %   Neutrophils Relative % 80 %   Neutro Abs 11.7 (H) 1.7 - 7.7 K/uL   Lymphocytes Relative 11 %   Lymphs Abs 1.5 0.7 - 4.0 K/uL   Monocytes Relative 8 %   Monocytes Absolute 1.2 (H) 0.1 - 1.0 K/uL   Eosinophils Relative 0 %   Eosinophils Absolute 0.0 0.0 - 0.5 K/uL   Basophils Relative 0 %   Basophils Absolute 0.0 0.0 - 0.1 K/uL   Immature Granulocytes 1 %   Abs Immature Granulocytes 0.07 0.00 - 0.07 K/uL    Comment: Performed at [facility], [address] [place], [place] [address]  Urinalysis, Complete w Microscopic     Status: Abnormal   Collection Time: 09/24/18  8:15 PM  Result Value Ref Range   Color, Urine YELLOW (A) YELLOW   APPearance CLEAR (A) CLEAR   Specific Gravity, Urine 1.012 1.005 - 1.030   pH 5.0 5.0 - 8.0   Glucose, UA NEGATIVE NEGATIVE mg/dL   Hgb urine dipstick NEGATIVE NEGATIVE   Bilirubin Urine NEGATIVE NEGATIVE   Ketones, ur 20 (A) NEGATIVE mg/dL   Protein, ur NEGATIVE NEGATIVE mg/dL   Nitrite NEGATIVE NEGATIVE   Leukocytes,Ua NEGATIVE NEGATIVE   RBC / HPF 0-5 0 - 5 RBC/hpf   WBC, UA 0-5 0 - 5 WBC/hpf   Bacteria, UA RARE (A) NONE SEEN   Squamous Epithelial / LPF 0-5 0 - 5   Mucus PRESENT     Comment: Performed at [facility], [address] [place], [place] [address]  TSH     Status: None   Collection Time: 09/24/18  8:15 PM  Result Value Ref Range   TSH 2.625 0.350 - 4.500 uIU/mL    Comment: Performed by a 3rd Generation assay with a functional sensitivity of <=0.01 uIU/mL. Performed at [facility], [address] [place], [place] [address]   Influenza panel by PCR (type A & B)     Status: None   Collection Time: 09/25/18  1:45 AM  Result Value Ref Range    Influenza A By PCR NEGATIVE NEGATIVE   Influenza B By PCR NEGATIVE NEGATIVE    Comment: (NOTE) The Xpert Xpress Flu assay is  intended as an aid in the diagnosis of  influenza and should not be used as a sole basis for treatment.  This  assay is FDA approved for nasopharyngeal swab specimens only. Nasal  washings and aspirates are unacceptable for Xpert Xpress Flu testing. Performed at [facility], [address] [place], [place] [address]    Dg Chest 2 View  Result Date: 09/25/2018 CLINICAL DATA:  Fever onset this afternoon to 101 max, cough, sinus congestion; history hypertension, COPD EXAM: CHEST - 2 VIEW COMPARISON:  None. FINDINGS: Normal heart size, mediastinal contours, and pulmonary vascularity. Atherosclerotic calcification aorta. Emphysematous and bronchitic changes consistent with COPD. Density of LEFT upper lobe question nodular density versus calcified plaque. Minimal atelectasis at RIGHT base. No definite infiltrate or pneumothorax. Bones demineralized. IMPRESSION: COPD changes with RIGHT basilar atelectasis and questionable LEFT upper lobe nodular density; CT chest recommended to exclude tumor. Electronically Signed   By: Ulyses Southward M.D.   On: 09/25/2018 00:49   Ct Chest W Contrast  Result Date: 09/25/2018 CLINICAL DATA:  Fever, cough and sinus congestion. EXAM: CT CHEST WITH CONTRAST TECHNIQUE: Multidetector CT imaging of the chest was performed during intravenous contrast administration. CONTRAST:  75mL OMNIPAQUE IOHEXOL 300 MG/ML  SOLN COMPARISON:  Chest radiograph 09/25/2018 FINDINGS: Cardiovascular: No significant vascular findings. Normal heart size. No pericardial effusion. Mediastinum/Nodes: No enlarged mediastinal, hilar, or axillary lymph nodes. Thyroid gland, trachea, and esophagus demonstrate no significant findings. Lungs/Pleura: Patchy bilateral predominantly subpleural areas of ground-glass opacities and tree-in-bud opacities. The most prominent area of  consolidation is in the left upper lobe, image 52/151, and it corresponds to the abnormality seen radiographically. Upper Abdomen: No acute abnormality. Musculoskeletal: No chest wall abnormality. No acute or significant osseous findings. IMPRESSION: Patchy bilateral predominantly subpleural areas of ground-glass and tree-in-bud opacities. The most prominent area of consolidation is in the left upper lobe and corresponds to the abnormality seen radiographically. In the acute clinical setting these findings are most consistent with bilateral pneumonia. Follow-up to resolution after empiric treatment is recommended to assure clearance, as underlying pulmonary nodule in the left upper lobe can not be excluded. Electronically Signed   By: Ted Mcalpine M.D.   On: 09/25/2018 02:33    Review of Systems  Constitutional: Negative for chills and fever.  HENT: Negative for sore throat and tinnitus.   Eyes: Negative for blurred vision and redness.  Respiratory: Positive for cough and shortness of breath.   Cardiovascular: Negative for chest pain, palpitations, orthopnea and PND.  Gastrointestinal: Negative for abdominal pain, diarrhea, nausea and vomiting.  Genitourinary: Negative for dysuria, frequency and urgency.  Musculoskeletal: Negative for joint pain and myalgias.  Skin: Negative for rash.       No lesions  Neurological: Negative for speech change, focal weakness and weakness.  Endo/Heme/Allergies: Does not bruise/bleed easily.       No temperature intolerance  Psychiatric/Behavioral: Negative for depression and suicidal ideas.    Blood pressure 122/68, pulse 80, temperature (!) 97.4 F (36.3 C), temperature source Oral, resp. rate 16, height  (1.499 m), weight 55.3 kg, SpO2 97 %. Physical Exam  Vitals reviewed. Constitutional: She is oriented to person, place, and time. She appears well-developed and well-nourished. No distress.  HENT:  Head: Normocephalic and atraumatic.   Mouth/Throat: Oropharynx is clear and moist.  Eyes: Pupils are equal, round, and reactive to light. Conjunctivae and EOM are normal. No scleral icterus.  Neck: Normal range of motion. Neck supple. No JVD present. No tracheal  deviation present. No thyromegaly present.  Cardiovascular: Normal rate, regular rhythm and normal heart sounds. Exam reveals no gallop and no friction rub.  No murmur heard. Respiratory: Effort normal and breath sounds normal.  GI: Soft. Bowel sounds are normal. She exhibits no distension. There is no abdominal tenderness.  Genitourinary:    Genitourinary Comments: Deferred   Musculoskeletal: Normal range of motion.        General: No edema.  Lymphadenopathy:    She has no cervical adenopathy.  Neurological: She is alert and oriented to person, place, and time. No cranial nerve deficit. She exhibits normal muscle tone.  Skin: Skin is warm and dry. No rash noted. No erythema.  Psychiatric: She has a normal mood and affect. Her behavior is normal. Judgment and thought content normal.     Assessment/Plan This is a 68 year old female admitted for pneumonia. 1.  Pneumonia: Community-acquired; continue Levaquin.  The patient does not meet criteria for sepsis.  Wean supplemental O2 as tolerated. No travel risk factors or known contact with individuals with exposure to novel coronavirus. 2.  Hypertension: Controlled; continue amlodipine and Toprol all. 3.  Hypokalemia: Potassium is mildly low on admission.  Continue potassium supplementation per home regimen. 4.  DVT prophylaxis: Lovenox 5.  GI prophylaxis: None The patient is a full code.  Time spent on admission orders and patient care approximately 45 minutes  Arnaldo Natal, MD 09/25/2018, 6:06 AM

## 2018-09-25 NOTE — ED Notes (Signed)
Pt leaving for imaging. Wearing mask.

## 2018-09-25 NOTE — ED Notes (Signed)
Pt states "my breathing feels much easier."

## 2018-09-25 NOTE — Plan of Care (Signed)
  Problem: Education: Goal: Knowledge of General Education information will improve Description: Including pain rating scale, medication(s)/side effects and non-pharmacologic comfort measures Outcome: Progressing   Problem: Activity: Goal: Ability to tolerate increased activity will improve Outcome: Progressing   Problem: Clinical Measurements: Goal: Ability to maintain a body temperature in the normal range will improve Outcome: Progressing   Problem: Respiratory: Goal: Ability to maintain adequate ventilation will improve Outcome: Progressing Goal: Ability to maintain a clear airway will improve Outcome: Progressing   

## 2018-09-26 LAB — CBC WITH DIFFERENTIAL/PLATELET
Abs Immature Granulocytes: 0.17 10*3/uL — ABNORMAL HIGH (ref 0.00–0.07)
BASOS PCT: 0 %
Basophils Absolute: 0 10*3/uL (ref 0.0–0.1)
Eosinophils Absolute: 0.1 10*3/uL (ref 0.0–0.5)
Eosinophils Relative: 1 %
HCT: 36.1 % (ref 36.0–46.0)
Hemoglobin: 11.8 g/dL — ABNORMAL LOW (ref 12.0–15.0)
Immature Granulocytes: 1 %
Lymphocytes Relative: 8 %
Lymphs Abs: 1.6 10*3/uL (ref 0.7–4.0)
MCH: 29.2 pg (ref 26.0–34.0)
MCHC: 32.7 g/dL (ref 30.0–36.0)
MCV: 89.4 fL (ref 80.0–100.0)
Monocytes Absolute: 1.4 10*3/uL — ABNORMAL HIGH (ref 0.1–1.0)
Monocytes Relative: 7 %
Neutro Abs: 17.4 10*3/uL — ABNORMAL HIGH (ref 1.7–7.7)
Neutrophils Relative %: 83 %
PLATELETS: 263 10*3/uL (ref 150–400)
RBC: 4.04 MIL/uL (ref 3.87–5.11)
RDW: 12.9 % (ref 11.5–15.5)
WBC: 20.7 10*3/uL — ABNORMAL HIGH (ref 4.0–10.5)
nRBC: 0.1 % (ref 0.0–0.2)

## 2018-09-26 MED ORDER — LEVOFLOXACIN 250 MG PO TABS: 250.0000 mg | ORAL_TABLET | Freq: Every day | ORAL | 0 refills | Status: AC

## 2018-09-26 NOTE — Progress Notes (Signed)
SATURATION QUALIFICATIONS: (This note is used to comply with regulatory documentation for home oxygen)  Patient Saturations on Room Air at Rest = 92%  Patient Saturations on Room Air while Ambulating = 81%  Patient Saturations on 2 Liters of oxygen while Ambulating = 94%  Please briefly explain why patient needs home oxygen:  COPD

## 2018-09-26 NOTE — Progress Notes (Signed)
Nutrition Brief Note  Patient identified on the Malnutrition Screening Tool (MST) Report  Wt Readings from Last 15 Encounters:  09/26/18 58.1 kg   Met with patient at bedside. She reports she had a decreased appetite for about 2 days PTA in setting of PNA. She reports a good appetite and intake at baseline. Her appetite has returned now and she is finishing most of her meals (90-100%). She reports she is weight-stable at around 125 lbs. Currently 58.1 kg (128.09 lbs). No fat or muscle fasting found on nutrition-focused physical exam. Patient does not meet criteria for malnutrition. No nutrition questions or concerns at this time.  Body mass index is 25.87 kg/m. Patient meets criteria for overweight based on current BMI.   Current diet order is heart healthy, patient is consuming approximately 90-100% of meals at this time. Labs and medications reviewed.   No nutrition interventions warranted at this time. If nutrition issues arise, please consult RD.   Willey Blade, MS, [place], LDN Office: [phone] Pager: [phone] After Hours/Weekend Pager: [phone]

## 2018-09-26 NOTE — Discharge Instructions (Signed)
Resume diet and activity as before ° ° °

## 2018-09-27 LAB — CBC WITH DIFFERENTIAL/PLATELET
Abs Immature Granulocytes: 0.22 10*3/uL — ABNORMAL HIGH (ref 0.00–0.07)
BASOS ABS: 0.1 10*3/uL (ref 0.0–0.1)
Basophils Relative: 1 %
Eosinophils Absolute: 0.1 10*3/uL (ref 0.0–0.5)
Eosinophils Relative: 1 %
HCT: 37.5 % (ref 36.0–46.0)
Hemoglobin: 12.1 g/dL (ref 12.0–15.0)
Immature Granulocytes: 2 %
Lymphocytes Relative: 18 %
Lymphs Abs: 2.2 10*3/uL (ref 0.7–4.0)
MCH: 29.3 pg (ref 26.0–34.0)
MCHC: 32.3 g/dL (ref 30.0–36.0)
MCV: 90.8 fL (ref 80.0–100.0)
Monocytes Absolute: 1.2 10*3/uL — ABNORMAL HIGH (ref 0.1–1.0)
Monocytes Relative: 9 %
Neutro Abs: 8.8 10*3/uL — ABNORMAL HIGH (ref 1.7–7.7)
Neutrophils Relative %: 69 %
Platelets: 311 10*3/uL (ref 150–400)
RBC: 4.13 MIL/uL (ref 3.87–5.11)
RDW: 13 % (ref 11.5–15.5)
WBC: 12.5 10*3/uL — ABNORMAL HIGH (ref 4.0–10.5)
nRBC: 0 % (ref 0.0–0.2)

## 2018-09-27 LAB — BASIC METABOLIC PANEL
Anion gap: 9 (ref 5–15)
BUN: 40 mg/dL — ABNORMAL HIGH (ref 8–23)
CO2: 30 mmol/L (ref 22–32)
Calcium: 9.7 mg/dL (ref 8.9–10.3)
Chloride: 101 mmol/L (ref 98–111)
Creatinine, Ser: 1.05 mg/dL — ABNORMAL HIGH (ref 0.44–1.00)
GFR calc non Af Amer: 55 mL/min — ABNORMAL LOW (ref >60)
Glucose, Bld: 86 mg/dL (ref 70–99)
Potassium: 3.7 mmol/L (ref 3.5–5.1)
Sodium: 140 mmol/L (ref 135–145)

## 2018-09-27 NOTE — Progress Notes (Signed)
Patient discharged home per MD order. Prescription given to patient. All discharge instructions given and all questions answered. 

## 2018-09-27 NOTE — Progress Notes (Addendum)
[facility] [facility] - [place] at [facility]   PATIENT NAME: Crystal Klein    MR#:  [id number]  DATE OF BIRTH:  [birth year]  SUBJECTIVE:  CHIEF COMPLAINT:   Chief Complaint  Patient presents with  . Fever   Still has SOB and cough  REVIEW OF SYSTEMS:    Review of Systems  Constitutional: Positive for malaise/fatigue. Negative for chills and fever.  HENT: Negative for sore throat.   Eyes: Negative for blurred vision, double vision and pain.  Respiratory: Positive for cough and shortness of breath. Negative for hemoptysis and wheezing.   Cardiovascular: Negative for chest pain, palpitations, orthopnea and leg swelling.  Gastrointestinal: Negative for abdominal pain, constipation, diarrhea, heartburn, nausea and vomiting.  Genitourinary: Negative for dysuria and hematuria.  Musculoskeletal: Negative for back pain and joint pain.  Skin: Negative for rash.  Neurological: Negative for sensory change, speech change, focal weakness and headaches.  Endo/Heme/Allergies: Does not bruise/bleed easily.  Psychiatric/Behavioral: Negative for depression. The patient is not nervous/anxious.     DRUG ALLERGIES:   Allergies  Allergen Reactions  . Amoxicillin Other (See Comments)    Reaction: unknown Did it involve swelling of the face/tongue/throat, SOB, or low BP? Unknown Did it involve sudden or severe rash/hives, skin peeling, or any reaction on the inside of your mouth or nose? Unknown Did you need to seek medical attention at a hospital or doctor's office? Unknown When did it last happen?unknown If all above answers are "NO", may proceed with cephalosporin use.   Marland Kitchen Penicillins Other (See Comments)    Reaction: unknown Did it involve swelling of the face/tongue/throat, SOB, or low BP? Unknown Did it involve sudden or severe rash/hives, skin peeling, or any reaction on the inside of your mouth or nose? Unknown Did you need to seek medical attention at a hospital or doctor's  office? Unknown When did it last happen?unknown If all above answers are "NO", may proceed with cephalosporin use.  . Biaxin [Clarithromycin] Rash    VITALS:  Blood pressure 125/65, pulse 80, temperature 98.8 F (37.1 C), temperature source Oral, resp. rate 16, height 4\' 11"  (1.499 m), weight 57.3 kg, SpO2 92 %.  PHYSICAL EXAMINATION:   Physical Exam  GENERAL:  68 y.o.-year-old patient lying in the bed with no acute distress.  EYES: Pupils equal, round, reactive to light and accommodation. No scleral icterus. Extraocular muscles intact.  HEENT: Head atraumatic, normocephalic. Oropharynx and nasopharynx clear.  NECK:  Supple, no jugular venous distention. No thyroid enlargement, no tenderness.  LUNGS: Normal breath sounds bilaterally, no wheezing, rales, rhonchi. No use of accessory muscles of respiration.  CARDIOVASCULAR: S1, S2 normal. No murmurs, rubs, or gallops.  ABDOMEN: Soft, nontender, nondistended. Bowel sounds present. No organomegaly or mass.  EXTREMITIES: No cyanosis, clubbing or edema b/l.    NEUROLOGIC: Cranial nerves II through XII are intact. No focal Motor or sensory deficits b/l.   PSYCHIATRIC: The patient is alert and oriented x 3.  SKIN: No obvious rash, lesion, or ulcer.   LABORATORY PANEL:   CBC Recent Labs  Lab 09/27/18 0742  WBC 12.5*  HGB 12.1  HCT 37.5  PLT 311   ------------------------------------------------------------------------------------------------------------------ Chemistries  Recent Labs  Lab 09/24/18 2015 09/27/18 0742  NA 135 140  K 3.4* 3.7  CL 95* 101  CO2 27 30  GLUCOSE 145* 86  BUN 22 40*  CREATININE 0.93 1.05*  CALCIUM 9.9 9.7  AST 43*  --   ALT 41  --   ALKPHOS 120  --  BILITOT 1.2  --    ------------------------------------------------------------------------------------------------------------------  Cardiac Enzymes No results for input(s): TROPONINI in the last 168  hours. ------------------------------------------------------------------------------------------------------------------  RADIOLOGY:  No results found.   ASSESSMENT AND PLAN:   This is a 68 year old female admitted for pneumonia. 1.  Pneumonia with acute hypoxic resp failure Community-acquired; continue Levaquin.   Nebs PRN 2.  Hypertension: Controlled; continue amlodipine and Toprol XL 3.  Hypokalemia: Replaced PO 4.  DVT prophylaxis: Lovenox  Weaned to RA at 92% but sats 81% on ambulation. Will need further inpatient care  All the records are reviewed and case discussed with Care Management/Social Worker Management plans discussed with the patient, family and they are in agreement.  CODE STATUS: FULL CODE  DVT Prophylaxis: SCDs  TOTAL TIME TAKING CARE OF THIS PATIENT: 35 minutes.   POSSIBLE D/C IN 1-2 DAYS, DEPENDING ON CLINICAL CONDITION.  Molinda Bailiff Brittish Bolinger M.D on 09/27/2018 at 12:24 PM  Between 7am to 6pm - Pager - [phone]  After 6pm go to [email or web address] - password [email or web address] [facility]  [facility]  Office  [phone]  CC: Primary care physician; Oswaldo Conroy, MD  Note: This dictation was prepared with Dragon dictation along with smaller phrase technology. Any transcriptional errors that result from this process are unintentional.

## 2018-10-01 NOTE — Discharge Summary (Signed)
[facility] - [place] [facility]   PATIENT NAME: Crystal Klein    MR#:  [id number]  DATE OF BIRTH:  25-Mar-1951  DATE OF ADMISSION:  09/25/2018 ADMITTING PHYSICIAN: Arnaldo Natal, MD  DATE OF DISCHARGE: 09/27/2018  2:13 PM  PRIMARY CARE PHYSICIAN: Oswaldo Conroy, MD   ADMISSION DIAGNOSIS:  Community acquired pneumonia, unspecified laterality [J18.9]  DISCHARGE DIAGNOSIS:  Active Problems:   CAP (community acquired pneumonia)   SECONDARY DIAGNOSIS:   Past Medical History:  Diagnosis Date  . COPD (chronic obstructive pulmonary disease) (HCC)   . Hypertension   . Renal disorder    stage 4 kidney disease     ADMITTING HISTORY  Chief Complaint: Fever HPI: The patient with past medical history of hypertension, CKD and COPD presents to the emergency department complaining of fever.  The patient reports a T-max of 101F.  The patient also complains of cough that has been nonproductive.  She admits to some shortness of breath which prompted her to seek evaluation emergency department where chest x-ray demonstrated left upper lobe nodular density consistent with pneumonia.  The patient received a dose of Levaquin in the emergency department prior to the hospitalist service being called for admission.  HOSPITAL COURSE:   This is a 68 year old female admitted for pneumonia. 1. Pneumonia with acute hypoxic resp failure Community-acquired; started on IV Levaquin.  Nebs PRN Oxygen to keep saturations greater than 92% 2. Hypertension: Controlled; continue amlodipine and Toprol XL 3. Hypokalemia: Replaced PO 4. DVT prophylaxis: Lovenox in the hospital  On the day of discharge patient ambulated around the hallway with saturations 96% on room air.  Afebrile.  Normal WBC.  Patient feels back to baseline with her breathing.  Discharged home in stable condition.  CONSULTS OBTAINED:    DRUG ALLERGIES:   Allergies  Allergen Reactions  . Amoxicillin Other  (See Comments)    Reaction: unknown Did it involve swelling of the face/tongue/throat, SOB, or low BP? Unknown Did it involve sudden or severe rash/hives, skin peeling, or any reaction on the inside of your mouth or nose? Unknown Did you need to seek medical attention at a hospital or doctor's office? Unknown When did it last happen?unknown If all above answers are "NO", may proceed with cephalosporin use.   Marland Kitchen Penicillins Other (See Comments)    Reaction: unknown Did it involve swelling of the face/tongue/throat, SOB, or low BP? Unknown Did it involve sudden or severe rash/hives, skin peeling, or any reaction on the inside of your mouth or nose? Unknown Did you need to seek medical attention at a hospital or doctor's office? Unknown When did it last happen?unknown If all above answers are "NO", may proceed with cephalosporin use.  . Biaxin [Clarithromycin] Rash    DISCHARGE MEDICATIONS:   Allergies as of 09/27/2018      Reactions   Amoxicillin Other (See Comments)   Reaction: unknown Did it involve swelling of the face/tongue/throat, SOB, or low BP? Unknown Did it involve sudden or severe rash/hives, skin peeling, or any reaction on the inside of your mouth or nose? Unknown Did you need to seek medical attention at a hospital or doctor's office? Unknown When did it last happen?unknown If all above answers are "NO", may proceed with cephalosporin use.   Penicillins Other (See Comments)   Reaction: unknown Did it involve swelling of the face/tongue/throat, SOB, or low BP? Unknown Did it involve sudden or severe rash/hives, skin peeling, or any reaction on the inside of your  mouth or nose? Unknown Did you need to seek medical attention at a hospital or doctor's office? Unknown When did it last happen?unknown If all above answers are "NO", may proceed with cephalosporin use.   Biaxin [clarithromycin] Rash      Medication List    TAKE these medications   amLODipine 5 MG  tablet Commonly known as:  NORVASC Take 5 mg by mouth daily.   levofloxacin 250 MG tablet Commonly known as:  Levaquin Take 1 tablet (250 mg total) by mouth daily.   metoprolol succinate 100 MG 24 hr tablet Commonly known as:  TOPROL-XL Take 100 mg by mouth daily.   potassium chloride 8 MEQ tablet Commonly known as:  KLOR-CON Take 8 mEq by mouth daily after lunch.   triamterene-hydrochlorothiazide 75-50 MG tablet Commonly known as:  MAXZIDE Take 1 tablet by mouth daily.   Vitamin D-3 25 MCG (1000 UT) Caps Take 1,000 Units by mouth daily after lunch.       Today   VITAL SIGNS:  Blood pressure 125/65, pulse 80, temperature 98.8 F (37.1 C), temperature source Oral, resp. rate 16, height 4\' 11"  (1.499 m), weight 57.3 kg, SpO2 92 %.  I/O:  No intake or output data in the 24 hours ending 10/01/18 1500  PHYSICAL EXAMINATION:  Physical Exam  GENERAL:  68 y.o.-year-old patient lying in the bed with no acute distress.  LUNGS: Normal breath sounds bilaterally, no wheezing, rales,rhonchi or crepitation. No use of accessory muscles of respiration.  CARDIOVASCULAR: S1, S2 normal. No murmurs, rubs, or gallops.  ABDOMEN: Soft, non-tender, non-distended. Bowel sounds present. No organomegaly or mass.  NEUROLOGIC: Moves all 4 extremities. PSYCHIATRIC: The patient is alert and oriented x 3.  SKIN: No obvious rash, lesion, or ulcer.   DATA REVIEW:   CBC Recent Labs  Lab 09/27/18 0742  WBC 12.5*  HGB 12.1  HCT 37.5  PLT 311    Chemistries  Recent Labs  Lab 09/24/18 2015 09/27/18 0742  NA 135 140  K 3.4* 3.7  CL 95* 101  CO2 27 30  GLUCOSE 145* 86  BUN 22 40*  CREATININE 0.93 1.05*  CALCIUM 9.9 9.7  AST 43*  --   ALT 41  --   ALKPHOS 120  --   BILITOT 1.2  --     Cardiac Enzymes No results for input(s): TROPONINI in the last 168 hours.  Microbiology Results  No results found for this or any previous visit.  RADIOLOGY:  No results found.  Follow up with  PCP in 1 week.  Management plans discussed with the patient, family and they are in agreement.  CODE STATUS:  Code Status History    Date Active Date Inactive Code Status Order ID Comments User Context   09/25/2018 0406 09/27/2018 1713 Full Code [id number]  Arnaldo Natal, MD ED      TOTAL TIME TAKING CARE OF THIS PATIENT ON DAY OF DISCHARGE: more than 30 minutes.   Molinda Bailiff Acel Natzke M.D on 10/01/2018 at 3:00 PM  Between 7am to 6pm - Pager - [phone]  After 6pm go to [email or web address] - password [email or web address] [facility]  [facility] [place] [facility]  Office  [phone]  CC: Primary care physician; Oswaldo Conroy, MD  Note: This dictation was prepared with Dragon dictation along with smaller phrase technology. Any transcriptional errors that result from this process are unintentional.

## 2019-10-17 ENCOUNTER — Other Ambulatory Visit: Payer: Self-pay | Admitting: Family Medicine

## 2019-10-17 DIAGNOSIS — Z1231 Encounter for screening mammogram for malignant neoplasm of breast: Secondary | ICD-10-CM

## 2019-10-20 ENCOUNTER — Ambulatory Visit
Admission: RE | Admit: 2019-10-20 | Discharge: 2019-10-20 | Disposition: A | Discharge status: Home / Self Care | Payer: Medicare HMO | Source: Ambulatory Visit | Attending: Family Medicine | Admitting: Family Medicine

## 2019-10-20 DIAGNOSIS — Z1231 Encounter for screening mammogram for malignant neoplasm of breast: Secondary | ICD-10-CM | POA: Diagnosis not present

## 2019-11-12 ENCOUNTER — Ambulatory Visit: Payer: Medicare HMO | Attending: Internal Medicine

## 2019-11-12 DIAGNOSIS — Z23 Encounter for immunization: Secondary | ICD-10-CM

## 2019-11-12 NOTE — Progress Notes (Signed)
   Covid-19 Vaccination Clinic  Name:  Crystal Klein    MRN: [id number] DOB: [birth year]  11/12/2019  Ms. [name] was observed post Covid-19 immunization for 15 minutes without incident. She was provided with Vaccine Information Sheet and instruction to access the V-Safe system.   Ms. [name] was instructed to call 911 with any severe reactions post vaccine: Marland Kitchen Difficulty breathing  . Swelling of face and throat  . A fast heartbeat  . A bad rash all over body  . Dizziness and weakness   Immunizations Administered    Name Date Dose VIS Date Route   Pfizer COVID-19 Vaccine 11/12/2019  8:13 AM 0.3 mL 08/31/2018 Intramuscular   Manufacturer: [employer], [employer]   Lot: [id number]   NDC: [address]-2

## 2019-12-06 ENCOUNTER — Ambulatory Visit: Payer: Medicare HMO | Attending: Internal Medicine

## 2019-12-06 DIAGNOSIS — Z23 Encounter for immunization: Secondary | ICD-10-CM

## 2019-12-06 NOTE — Progress Notes (Signed)
   Covid-19 Vaccination Clinic  Name:  Crystal Klein    MRN: [id number] DOB: 09/28/1950  12/06/2019  Crystal Klein was observed post Covid-19 immunization for 15 minutes without incident. She was provided with Vaccine Information Sheet and instruction to access the V-Safe system.   Crystal Klein was instructed to call 911 with any severe reactions post vaccine: Marland Kitchen Difficulty breathing  . Swelling of face and throat  . A fast heartbeat  . A bad rash all over body  . Dizziness and weakness   Immunizations Administered    Name Date Dose VIS Date Route   Pfizer COVID-19 Vaccine 12/06/2019  8:15 AM 0.3 mL 08/31/2018 Intramuscular   Manufacturer: [employer], [employer]   Lot: [id number]   NDC: [address]-2

## 2020-09-11 ENCOUNTER — Other Ambulatory Visit: Payer: Self-pay | Admitting: Family Medicine

## 2020-09-11 DIAGNOSIS — Z1231 Encounter for screening mammogram for malignant neoplasm of breast: Secondary | ICD-10-CM

## 2020-11-05 ENCOUNTER — Other Ambulatory Visit: Payer: Self-pay

## 2020-11-05 ENCOUNTER — Ambulatory Visit
Admission: RE | Admit: 2020-11-05 | Discharge: 2020-11-05 | Disposition: A | Discharge status: Home / Self Care | Payer: Medicare HMO | Source: Ambulatory Visit | Attending: Family Medicine | Admitting: Family Medicine

## 2020-11-05 DIAGNOSIS — Z1231 Encounter for screening mammogram for malignant neoplasm of breast: Secondary | ICD-10-CM | POA: Diagnosis present

## 2021-09-26 ENCOUNTER — Other Ambulatory Visit: Payer: Self-pay | Admitting: Family Medicine

## 2021-09-26 DIAGNOSIS — Z1231 Encounter for screening mammogram for malignant neoplasm of breast: Secondary | ICD-10-CM

## 2021-11-06 ENCOUNTER — Ambulatory Visit
Admission: RE | Admit: 2021-11-06 | Discharge: 2021-11-06 | Disposition: A | Discharge status: Home / Self Care | Payer: Medicare HMO | Source: Ambulatory Visit | Attending: Family Medicine | Admitting: Family Medicine

## 2021-11-06 DIAGNOSIS — Z1231 Encounter for screening mammogram for malignant neoplasm of breast: Secondary | ICD-10-CM | POA: Insufficient documentation

## 2022-10-07 ENCOUNTER — Other Ambulatory Visit: Payer: Self-pay | Admitting: Family Medicine

## 2022-10-07 DIAGNOSIS — Z1231 Encounter for screening mammogram for malignant neoplasm of breast: Secondary | ICD-10-CM

## 2022-11-10 ENCOUNTER — Ambulatory Visit
Admission: RE | Admit: 2022-11-10 | Discharge: 2022-11-10 | Disposition: A | Discharge status: Home / Self Care | Payer: Medicare HMO | Source: Ambulatory Visit | Attending: Family Medicine | Admitting: Family Medicine

## 2022-11-10 DIAGNOSIS — Z1231 Encounter for screening mammogram for malignant neoplasm of breast: Secondary | ICD-10-CM | POA: Insufficient documentation

## 2023-09-16 ENCOUNTER — Other Ambulatory Visit: Payer: Self-pay | Admitting: Family Medicine

## 2023-09-16 DIAGNOSIS — Z1231 Encounter for screening mammogram for malignant neoplasm of breast: Secondary | ICD-10-CM

## 2024-01-27 ENCOUNTER — Ambulatory Visit
Admission: RE | Admit: 2024-01-27 | Discharge: 2024-01-27 | Disposition: A | Discharge status: Home / Self Care | Source: Ambulatory Visit | Attending: Family Medicine | Admitting: Family Medicine

## 2024-01-27 DIAGNOSIS — Z1231 Encounter for screening mammogram for malignant neoplasm of breast: Secondary | ICD-10-CM | POA: Diagnosis present

## 2024-04-06 IMAGING — MG MM DIGITAL SCREENING BILAT W/ TOMO AND CAD
6 of 10 series · 6 of 30 positions shown · non-contrast
Comparison: Previous exam(s).

CLINICAL DATA: Screening.

EXAM:
DIGITAL SCREENING BILATERAL MAMMOGRAM WITH TOMOSYNTHESIS AND CAD
TECHNIQUE: Bilateral screening digital craniocaudal and mediolateral oblique
mammograms were obtained. Bilateral screening digital breast
tomosynthesis was performed. The images were evaluated with
computer-aided detection.

[L CC synth-2D]
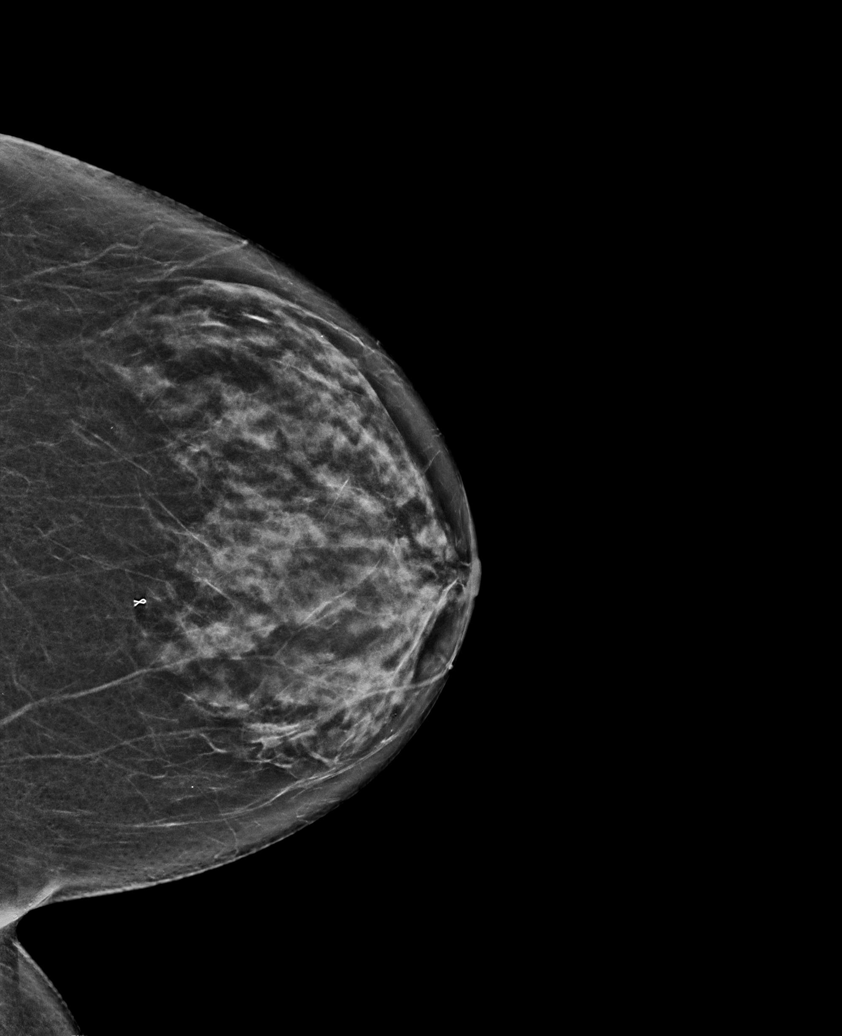

[R MLO synth-2D (1 of 2)]
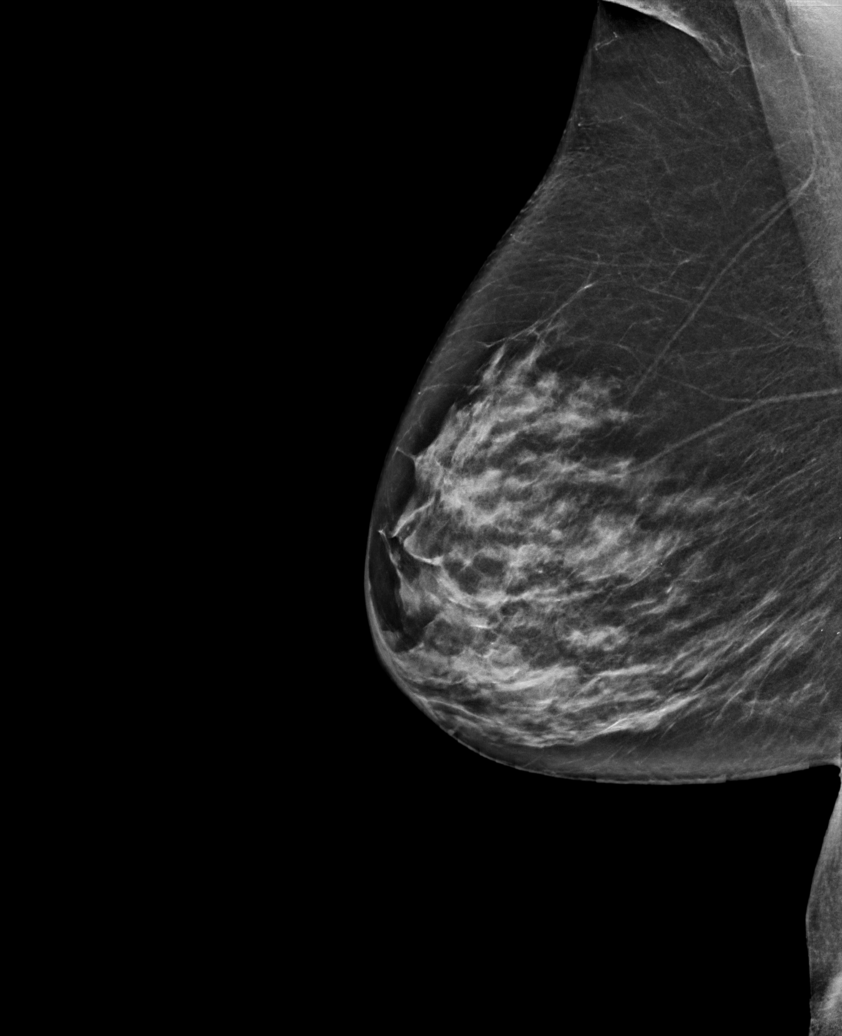

[R MLO synth-2D (2 of 2)]
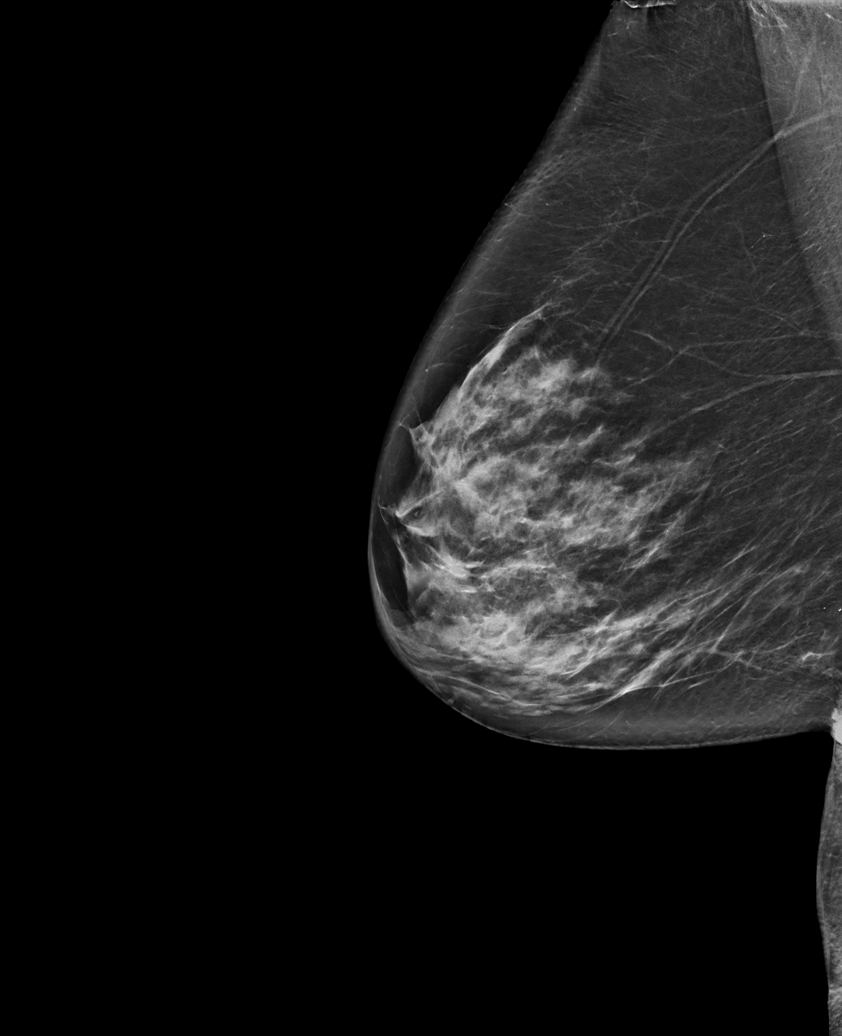

[R CC synth-2D]
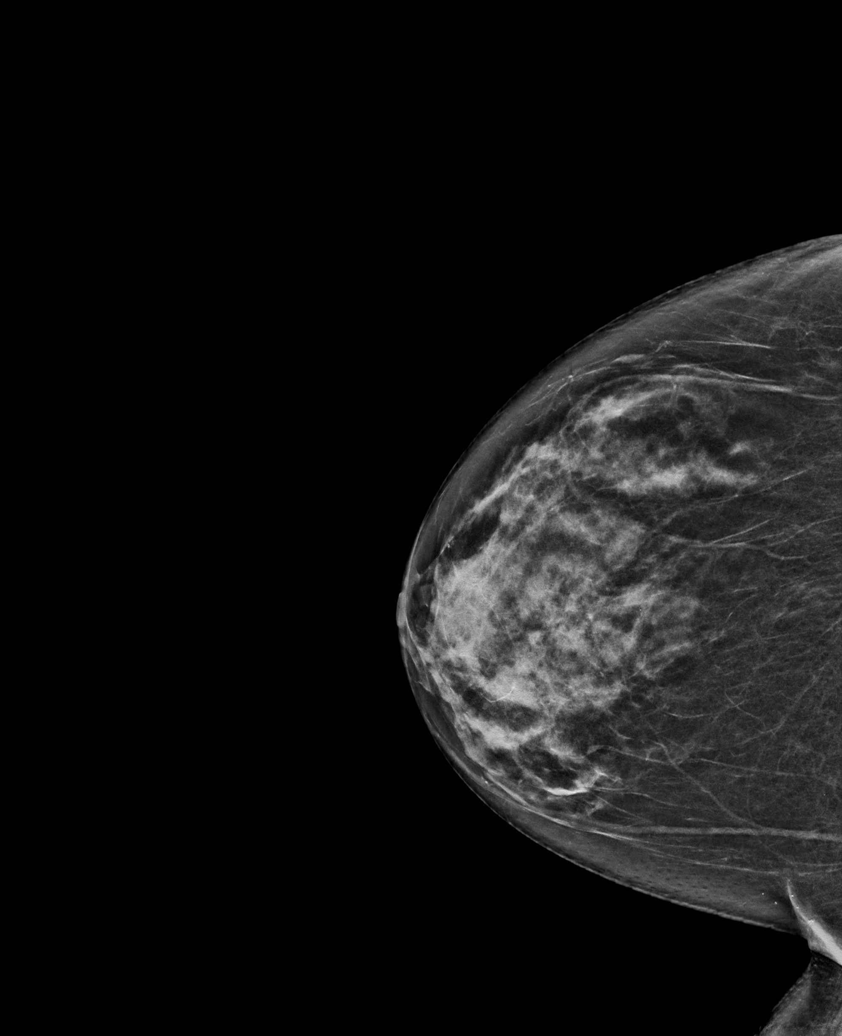

[L MLO synth-2D]
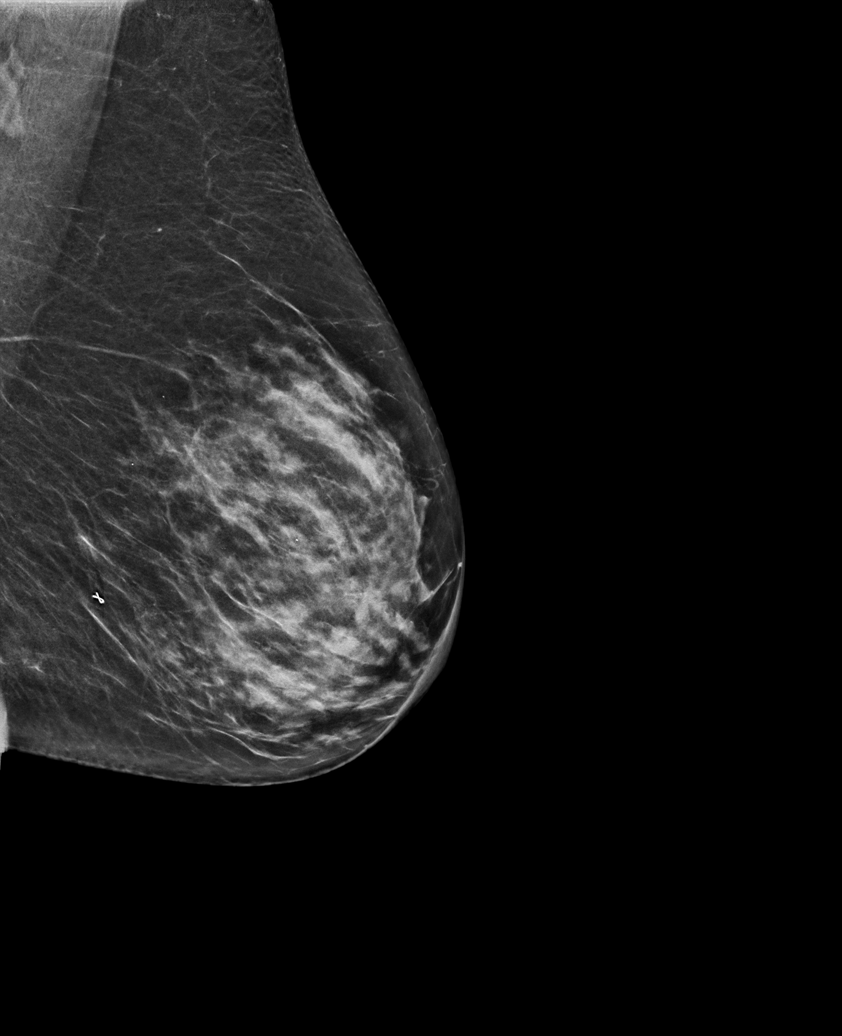

[R MLO tomo · tomo slice 33/66.0]
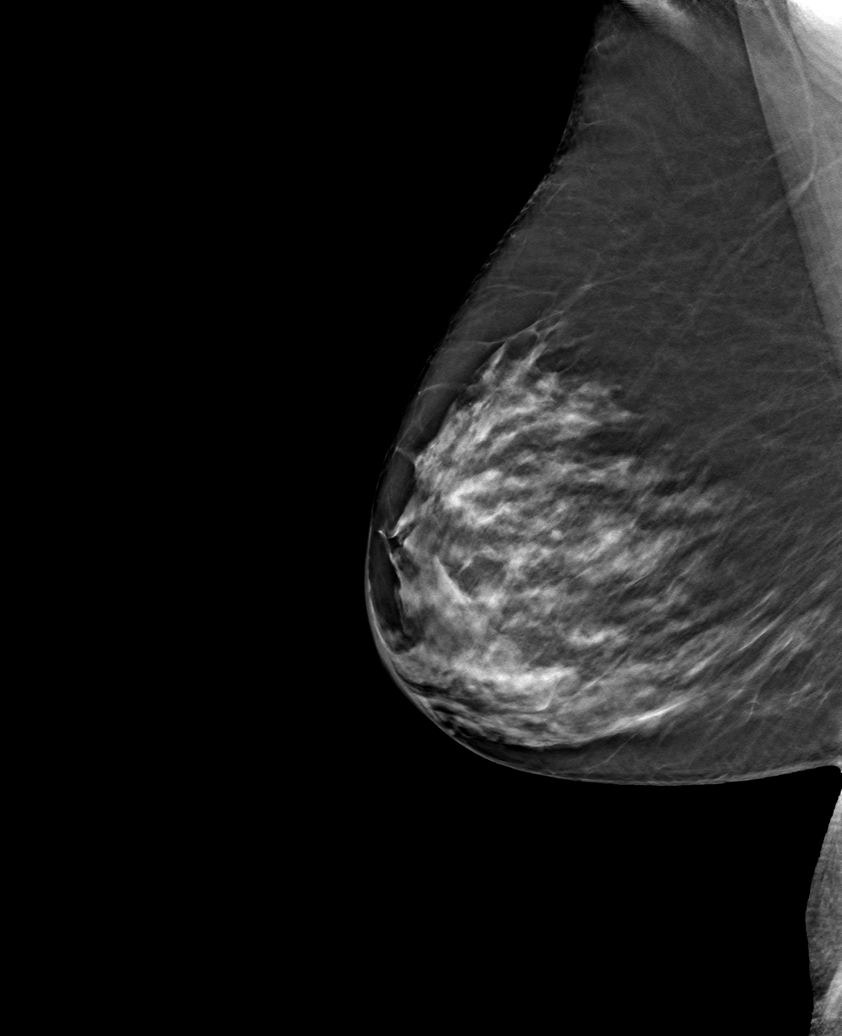

[6 of 30 positions shown; findings below may reference images not displayed]

ACR Breast Density Category c: The breast tissue is heterogeneously
dense, which may obscure small masses.
FINDINGS: There are no findings suspicious for malignancy.
IMPRESSION: No mammographic evidence of malignancy. A result letter of this
screening mammogram will be mailed directly to the patient.

RECOMMENDATION:
Screening mammogram in one year. (Code:Q3-[id number])

BI-RADS CATEGORY  1: Negative.

## 2024-06-10 ENCOUNTER — Other Ambulatory Visit: Payer: Self-pay | Admitting: Family Medicine

## 2024-06-10 DIAGNOSIS — Z780 Asymptomatic menopausal state: Secondary | ICD-10-CM
